# Patient Record
Sex: Male | Born: 1980 | Race: Black or African American | Hispanic: No | Marital: Single | Smoking: Current every day smoker
Health system: Southern US, Community
[De-identification: ages and names within clinical notes are randomized; demographics above are authoritative.]

## PROBLEM LIST (undated history)

## (undated) DIAGNOSIS — Q8989 Other specified congenital malformations: Secondary | ICD-10-CM

## (undated) DIAGNOSIS — Q898 Other specified congenital malformations: Secondary | ICD-10-CM

---

## 2005-11-30 ENCOUNTER — Emergency Department: Payer: Self-pay | Admitting: Emergency Medicine

## 2005-12-06 ENCOUNTER — Emergency Department: Payer: Self-pay | Admitting: Emergency Medicine

## 2007-07-17 ENCOUNTER — Emergency Department: Payer: Self-pay | Admitting: Internal Medicine

## 2012-04-26 ENCOUNTER — Emergency Department: Payer: Self-pay | Admitting: Emergency Medicine

## 2015-10-12 ENCOUNTER — Encounter (HOSPITAL_COMMUNITY): Payer: Self-pay | Admitting: *Deleted

## 2015-10-12 ENCOUNTER — Emergency Department (HOSPITAL_COMMUNITY)
Admission: EM | Admit: 2015-10-12 | Discharge: 2015-10-12 | Disposition: A | Discharge status: Home / Self Care | Payer: Self-pay | Attending: Emergency Medicine | Admitting: Emergency Medicine

## 2015-10-12 DIAGNOSIS — W290XXA Contact with powered kitchen appliance, initial encounter: Secondary | ICD-10-CM | POA: Insufficient documentation

## 2015-10-12 DIAGNOSIS — S61512A Laceration without foreign body of left wrist, initial encounter: Secondary | ICD-10-CM | POA: Insufficient documentation

## 2015-10-12 DIAGNOSIS — Y929 Unspecified place or not applicable: Secondary | ICD-10-CM | POA: Insufficient documentation

## 2015-10-12 DIAGNOSIS — F17210 Nicotine dependence, cigarettes, uncomplicated: Secondary | ICD-10-CM | POA: Insufficient documentation

## 2015-10-12 DIAGNOSIS — Y9389 Activity, other specified: Secondary | ICD-10-CM | POA: Insufficient documentation

## 2015-10-12 DIAGNOSIS — Y990 Civilian activity done for income or pay: Secondary | ICD-10-CM | POA: Insufficient documentation

## 2015-10-12 MED ORDER — TETANUS-DIPHTH-ACELL PERTUSSIS 5-2.5-18.5 LF-MCG/0.5 IM SUSP: 0.5000 mL | Freq: Once | INTRAMUSCULAR | Status: DC

## 2015-10-12 MED ORDER — IBUPROFEN 800 MG PO TABS
800.0000 mg | ORAL_TABLET | Freq: Once | ORAL | Status: AC
  Administered 2015-10-12: 800 mg via ORAL
  Filled 2015-10-12: qty 1

## 2015-10-12 MED ORDER — PENTAFLUOROPROP-TETRAFLUOROETH EX AERO: INHALATION_SPRAY | Freq: Once | CUTANEOUS | Status: DC

## 2015-10-12 MED ORDER — PENTAFLUOROPROP-TETRAFLUOROETH EX AERO
INHALATION_SPRAY | CUTANEOUS | Status: AC
  Filled 2015-10-12: qty 103.5

## 2015-10-12 NOTE — Discharge Instructions (Signed)
Your wound was repaired with staples. Please keep your wound clean and dry. Please have these removed in 7 days. Please see your primary physician, or return to the emergency department if any pus like drainage, increased redness, red streaks, or fever, or signs of infection. Use Tylenol or ibuprofen for soreness. Stitches, Staples, or Adhesive Wound Closure Doctors use stitches (sutures), staples, and certain glue (skin adhesives) to hold your skin together while it heals (wound closure). You may need this treatment after you have surgery or if you cut your skin accidentally. These methods help your skin heal more quickly. They also make it less likely that you will have a scar. WHAT ARE THE DIFFERENT KINDS OF WOUND CLOSURES? There are many options for wound closure. The one that your doctor uses depends on how deep and large your wound is. Adhesive Glue To use this glue to close a wound, your doctor holds the edges of the wound together and paints the glue on the surface of your skin. You may need more than one layer of glue. Then the wound may be covered with a light bandage (dressing). This type of skin closure may be used for small wounds that are not deep (superficial). Using glue for wound closure is less painful than other methods. It does not require a medicine that numbs the area. This method also leaves nothing to be removed. Adhesive glue is often used for children and on facial wounds. Adhesive glue cannot be used for wounds that are deep, uneven, or bleeding. It is not used inside of a wound.  Adhesive Strips These strips are made of sticky (adhesive), porous paper. They are placed across your skin edges like a regular adhesive bandage. You leave them on until they fall off. Adhesive strips may be used to close very superficial wounds. They may also be used along with sutures to improve closure of your skin edges.  Sutures Sutures are the oldest method of wound closure. Sutures can be made  from natural or synthetic materials. They can be made from a material that your body can break down as your wound heals (absorbable), or they can be made from a material that needs to be removed from your skin (nonabsorbable). They come in many different strengths and sizes. Your doctor attaches the sutures to a steel needle on one end. Sutures can be passed through your skin, or through the tissues beneath your skin. Then they are tied and cut. Your skin edges may be closed in one continuous stitch or in separate stitches. Sutures are strong and can be used for all kinds of wounds. Absorbable sutures may be used to close tissues under the skin. The disadvantage of sutures is that they may cause skin reactions that lead to infection. Nonabsorbable sutures need to be removed. Staples When surgical staples are used to close a wound, the edges of your skin on both sides of the wound are brought close together. A staple is placed across the wound, and an instrument secures the edges together. Staples are often used to close surgical cuts (incisions). Staples are faster to use than sutures, and they cause less reaction from your skin. Staples need to be removed using a tool that bends the staples away from your skin. HOW DO I CARE FOR MY WOUND CLOSURE?  Take medicines only as told by your doctor.  If you were prescribed an antibiotic medicine for your wound, finish it all even if you start to feel better.  Use ointments  or creams only as told by your doctor.  Wash your hands with soap and water before and after touching your wound.  Do not soak your wound in water. Do not take baths, swim, or use a hot tub until your doctor says it is okay.  Ask your doctor when you can start showering. Cover your wound if told by your doctor.  Do not take out your own sutures or staples.  Do not pick at your wound. Picking can cause an infection.  Keep all follow-up visits as told by your doctor. This is  important. HOW LONG WILL I HAVE MY WOUND CLOSURE?   Leave adhesive glue on your skin until the glue peels away.  Leave adhesive strips on your skin until they fall off.  Absorbable sutures will dissolve within several days.  Nonabsorbable sutures and staples must be removed. The location of the wound will determine how long they stay in. This can range from several days to a couple of weeks. WHEN SHOULD I SEEK HELP FOR MY WOUND CLOSURE? Contact your doctor if:  You have a fever.  You have chills.  You have redness, puffiness (swelling), or pain at the site of your wound.  You have fluid, blood, or pus coming from your wound.  There is a bad smell coming from your wound.  The skin edges of your wound start to separate after your sutures have been removed.  Your wound becomes thick, raised, and darker in color after your sutures come out (scarring).   This information is not intended to replace advice given to you by your health care provider. Make sure you discuss any questions you have with your health care provider.   Document Released: 02/05/2009 Document Revised: 05/01/2014 Document Reviewed: 09/17/2013 Elsevier Interactive Patient Education [employer]2016 Elsevier Inc.

## 2015-10-12 NOTE — ED Notes (Signed)
Pt. Reports left wrist laceration from electric grinder while at work.

## 2015-10-12 NOTE — ED Notes (Signed)
PA at bedside.

## 2015-10-12 NOTE — ED Provider Notes (Signed)
CSN: [id number]     Arrival date & time 10/12/15  0957 History   First MD Initiated Contact with Patient 10/12/15 1025     Chief Complaint  Patient presents with  . Extremity Laceration     (Consider location/radiation/quality/duration/timing/severity/associated sxs/prior Treatment) HPI Comments: Patient is a 35 year old male  who presents to the emergency department with a complaint of laceration to the left wrist.  The patient states he was using an electric grinder while he was at work. There was a problem with the post that he was cutting, and he sustained a laceration onto the left wrist on. He was able to get the bleeding controlled by applying pressure. He states that he is not on anticoagulation medications. He has good range of motion of his wrist and fingers. He presents to the emergency department for evaluation and laceration repair.  The history is provided by the patient.    History reviewed. No pertinent past medical history. History reviewed. No pertinent past surgical history. History reviewed. No pertinent family history. Social History  Substance Use Topics  . Smoking status: Current Every Day Smoker -- 0.25 packs/day    Types: Cigarettes  . Smokeless tobacco: None  . Alcohol Use: No    Review of Systems  Constitutional: Negative for activity change.       All ROS Neg except as noted in HPI  HENT: Negative for nosebleeds.   Eyes: Negative for photophobia and discharge.  Respiratory: Negative for cough, shortness of breath and wheezing.   Cardiovascular: Negative for chest pain and palpitations.  Gastrointestinal: Negative for abdominal pain and blood in stool.  Genitourinary: Negative for dysuria, frequency and hematuria.  Musculoskeletal: Negative for back pain, arthralgias and neck pain.  Skin: Negative.   Neurological: Negative for dizziness, seizures and speech difficulty.  Psychiatric/Behavioral: Negative for hallucinations and confusion.  All other  systems reviewed and are negative.     Allergies  Review of patient's allergies indicates no known allergies.  Home Medications   Prior to Admission medications   Not on File   BP 143/91 mmHg  Pulse 81  Temp(Src) 98 F (36.7 C) (Oral)  Resp 18  Ht 5\' 11"  (1.803 m)  Wt 79.379 kg  BMI 24.42 kg/m2  SpO2 98% Physical Exam  Constitutional: He is oriented to person, place, and time. He appears well-developed and well-nourished.  Non-toxic appearance.  HENT:  Head: Normocephalic.  Right Ear: Tympanic membrane and external ear normal.  Left Ear: Tympanic membrane and external ear normal.  Eyes: EOM and lids are normal. Pupils are equal, round, and reactive to light.  Neck: Normal range of motion. Neck supple. Carotid bruit is not present.  Cardiovascular: Normal rate, regular rhythm, normal heart sounds, intact distal pulses and normal pulses.   Pulmonary/Chest: Breath sounds normal. No respiratory distress.  Abdominal: Soft. Bowel sounds are normal. There is no tenderness. There is no guarding.  Musculoskeletal: Normal range of motion.       Left wrist: He exhibits tenderness and laceration. He exhibits normal range of motion.       Arms: No bone or tendon involvement of the laceration of the left wrist. Capillary refill is less than 2 seconds. Radial pulses 2+.  Lymphadenopathy:       Head (right side): No submandibular adenopathy present.       Head (left side): No submandibular adenopathy present.    He has no cervical adenopathy.  Neurological: He is alert and oriented to person, place, and time.  He has normal strength. No cranial nerve deficit or sensory deficit.  Skin: Skin is warm and dry.  Psychiatric: He has a normal mood and affect. His speech is normal.  Nursing note and vitals reviewed.   ED Course  .Marland KitchenLaceration Repair Date/Time: 10/12/2015 11:18 AM Performed by: Ivery Quale Authorized by: Ivery Quale Consent: Verbal consent obtained. Risks and benefits:  risks, benefits and alternatives were discussed Consent given by: patient Patient understanding: patient states understanding of the procedure being performed Patient identity confirmed: arm band Time out: Immediately prior to procedure a "time out" was called to verify the correct patient, procedure, equipment, support staff and site/side marked as required. Body area: upper extremity Location details: left wrist Laceration length: 2.4 cm Foreign bodies: no foreign bodies Tendon involvement: none Vascular damage: no Local anesthetic: topical anesthetic (GeBauers spray) Preparation: Patient was prepped and draped in the usual sterile fashion. Irrigation solution: saline Skin closure: staples Number of sutures: 4 Approximation: close Approximation difficulty: simple Dressing: gauze roll Patient tolerance: Patient tolerated the procedure well with no immediate complications   (including critical care time) Labs Review Labs Reviewed - No data to display  Imaging Review No results found. I have personally reviewed and evaluated these images and lab results as part of my medical decision-making.   EKG Interpretation None      MDM Vital signs reviewed. Patient states his tetanus status is up-to-date. The wound was repaired with staples. We discussed the need to have the staples removed in 7 days. The patient acknowledges understanding of the discharge instructions, as well as how to keep the wound clean and dry. Patient is in agreement with this discharge plan.    Final diagnoses:  Laceration of wrist, left, initial encounter    **I have reviewed nursing notes, vital signs, and all appropriate lab and imaging results for this patient.Ivery Quale, PA-C 10/12/15 1212  Azalia Bilis, MD 10/12/15 [phone]

## 2015-10-19 ENCOUNTER — Encounter (HOSPITAL_COMMUNITY): Payer: Self-pay | Admitting: Emergency Medicine

## 2015-10-19 ENCOUNTER — Emergency Department (HOSPITAL_COMMUNITY)
Admission: EM | Admit: 2015-10-19 | Discharge: 2015-10-19 | Disposition: A | Discharge status: Home / Self Care | Payer: Self-pay | Attending: Emergency Medicine | Admitting: Emergency Medicine

## 2015-10-19 DIAGNOSIS — Z4802 Encounter for removal of sutures: Secondary | ICD-10-CM | POA: Insufficient documentation

## 2015-10-19 DIAGNOSIS — F17210 Nicotine dependence, cigarettes, uncomplicated: Secondary | ICD-10-CM | POA: Insufficient documentation

## 2015-10-19 NOTE — Discharge Instructions (Signed)
Incision Care °An incision is when a surgeon cuts into your body. After surgery, the incision needs to be cared for properly to prevent infection.  °HOW TO CARE FOR YOUR INCISION °· Take medicines only as directed by your health care provider. °· There are many different ways to close and cover an incision, including stitches, skin glue, and adhesive strips. Follow your health care provider's instructions on: °¨ Incision care. °¨ Bandage (dressing) changes and removal. °¨ Incision closure removal. °· Do not take baths, swim, or use a hot tub until your health care provider approves. You may shower as directed by your health care provider. °· Resume your normal diet and activities as directed. °· Use anti-itch medicine (such as an antihistamine) as directed by your health care provider. The incision may itch while it is healing. Do not pick or scratch at the incision. °· Drink enough fluid to keep your urine clear or pale yellow. °SEEK MEDICAL CARE IF:  °· You have drainage, redness, swelling, or pain at your incision site. °· You have muscle aches, chills, or a general ill feeling. °· You notice a bad smell coming from the incision or dressing. °· Your incision edges separate after the sutures, staples, or skin adhesive strips have been removed. °· You have persistent nausea or vomiting. °· You have a fever. °· You are dizzy. °SEEK IMMEDIATE MEDICAL CARE IF:  °· You have a rash. °· You faint. °· You have difficulty breathing. °MAKE SURE YOU:  °· Understand these instructions. °· Will watch your condition. °· Will get help right away if you are not doing well or get worse. °  °This information is not intended to replace advice given to you by your health care provider. Make sure you discuss any questions you have with your health care provider. °  °Document Released: 10/28/2004 Document Revised: 05/01/2014 Document Reviewed: 06/04/2013 °Elsevier Interactive Patient Education ©2016 Elsevier Inc. ° °

## 2015-10-19 NOTE — ED Provider Notes (Signed)
CSN: [id number]     Arrival date & time 10/19/15  1243 History   First MD Initiated Contact with Patient 10/19/15 1257     Chief Complaint  Patient presents with  . Suture / Staple Removal     (Consider location/radiation/quality/duration/timing/severity/associated sxs/prior Treatment) HPI Comments: Patient presents to the emergency department with chief complaint of requesting staple removal. He states that he sustained a laceration to his left wrist from a piece of metal one week ago. He reports good wound healing. Denies any discharge. Denies any increased pain or fever. Denies any complaints at this time.  The history is provided by the patient. No language interpreter was used.    History reviewed. No pertinent past medical history. History reviewed. No pertinent past surgical history. History reviewed. No pertinent family history. Social History  Substance Use Topics  . Smoking status: Current Every Day Smoker -- 0.25 packs/day    Types: Cigarettes  . Smokeless tobacco: None  . Alcohol Use: No    Review of Systems  Constitutional: Negative for fever.  Skin: Positive for wound.  All other systems reviewed and are negative.     Allergies  Review of patient's allergies indicates no known allergies.  Home Medications   Prior to Admission medications   Not on File   BP 115/74 mmHg  Pulse 83  Temp(Src) 98.3 F (36.8 C) (Oral)  Resp 14  Ht 5\' 11"  (1.803 m)  Wt 79.379 kg  BMI 24.42 kg/m2  SpO2 100% Physical Exam  Constitutional: He is oriented to person, place, and time. He appears well-developed and well-nourished.  HENT:  Head: Normocephalic and atraumatic.  Eyes: Conjunctivae and EOM are normal.  Neck: Normal range of motion.  Cardiovascular: Normal rate.   Pulmonary/Chest: Effort normal.  Abdominal: He exhibits no distension.  Musculoskeletal: Normal range of motion.  Neurological: He is alert and oriented to person, place, and time.  Skin: Skin is dry.   3 cm well healing laceration, 4 staples intact  Psychiatric: He has a normal mood and affect. His behavior is normal. Judgment and thought content normal.  Nursing note and vitals reviewed.   ED Course  Procedures (including critical care time) SUTURE REMOVAL Performed by: Roxy HorsemanBROWNING, Dariella Gillihan  Consent: Verbal consent obtained. Consent given by: patient Required items: required blood products, implants, devices, and special equipment available Time out: Immediately prior to procedure a "time out" was called to verify the correct patient, procedure, equipment, support staff and site/side marked as required.  Location: left wrist  Wound Appearance: clean  Sutures/Staples Removed: 4  Patient tolerance: Patient tolerated the procedure well with no immediate complications.      MDM   Final diagnoses:  Encounter for staple removal    Well healing wound.  Staples removed without complication.    Roxy HorsemanRobert Jannine Abreu, PA-C 10/19/15 1335  Marily MemosJason Mesner, MD 10/19/15 1459

## 2015-10-19 NOTE — ED Notes (Addendum)
Pt here for staple removal from wrist.

## 2015-10-21 ENCOUNTER — Emergency Department
Admission: EM | Admit: 2015-10-21 | Discharge: 2015-10-21 | Disposition: A | Discharge status: Home / Self Care | Payer: Self-pay | Attending: Emergency Medicine | Admitting: Emergency Medicine

## 2015-10-21 DIAGNOSIS — A64 Unspecified sexually transmitted disease: Secondary | ICD-10-CM | POA: Insufficient documentation

## 2015-10-21 DIAGNOSIS — F17210 Nicotine dependence, cigarettes, uncomplicated: Secondary | ICD-10-CM | POA: Insufficient documentation

## 2015-10-21 LAB — URINALYSIS COMPLETE WITH MICROSCOPIC (ARMC ONLY)
BILIRUBIN URINE: NEGATIVE
Glucose, UA: NEGATIVE mg/dL
Hgb urine dipstick: NEGATIVE
KETONES UR: NEGATIVE mg/dL
Nitrite: NEGATIVE
PROTEIN: 30 mg/dL — AB
Specific Gravity, Urine: 1.021 (ref 1.005–1.030)
Squamous Epithelial / LPF: NONE SEEN
pH: 8 (ref 5.0–8.0)

## 2015-10-21 MED ORDER — AZITHROMYCIN 500 MG PO TABS
1000.0000 mg | ORAL_TABLET | Freq: Once | ORAL | Status: AC
  Administered 2015-10-21: 1000 mg via ORAL
  Filled 2015-10-21: qty 2

## 2015-10-21 MED ORDER — AZITHROMYCIN 250 MG PO TABS
ORAL_TABLET | ORAL | Status: AC
  Administered 2015-10-21: 1000 mg via ORAL
  Filled 2015-10-21: qty 2

## 2015-10-21 MED ORDER — CEFTRIAXONE SODIUM 250 MG IJ SOLR
250.0000 mg | Freq: Once | INTRAMUSCULAR | Status: AC
  Administered 2015-10-21: 250 mg via INTRAMUSCULAR

## 2015-10-21 MED ORDER — LIDOCAINE HCL (PF) 1 % IJ SOLN
INTRAMUSCULAR | Status: AC
  Administered 2015-10-21: 1 mL
  Filled 2015-10-21: qty 5

## 2015-10-21 MED ORDER — CEFTRIAXONE SODIUM 1 G IJ SOLR
INTRAMUSCULAR | Status: AC
  Administered 2015-10-21: 250 mg via INTRAMUSCULAR
  Filled 2015-10-21: qty 10

## 2015-10-21 NOTE — ED Provider Notes (Signed)
[facility] Emergency Department Provider Note  ____________________________________________  Time seen: Approximately 11:40 PM  I have reviewed the triage vital signs and the nursing notes.   HISTORY  Chief Complaint Exposure to STD    HPI Charles Lowery is a 35 y.o. male who has 2 days of dysuria with penile discharge. Reports unprotected sex. No fevers or chills. No nausea or vomiting. No abdominal pain.   No past medical history on file.  There are no active problems to display for this patient.   No past surgical history on file.  No current outpatient prescriptions on file.  Allergies Review of patient's allergies indicates no known allergies.  No family history on file.  Social History Social History  Substance Use Topics  . Smoking status: Current Every Day Smoker -- 0.25 packs/day    Types: Cigarettes  . Smokeless tobacco: Not on file  . Alcohol Use: No    Review of Systems Constitutional: No fever/chills Eyes: No visual changes. ENT: No sore throat. Cardiovascular: Denies chest pain. Respiratory: Denies shortness of breath. Gastrointestinal: No abdominal pain.  No nausea, no vomiting.  No diarrhea.  No constipation. Genitourinary: Negative for dysuria. Musculoskeletal: Negative for back pain. Skin: Negative for rash. Neurological: Negative for headaches, focal weakness or numbness. 10-point ROS otherwise negative.  ____________________________________________   PHYSICAL EXAM:  VITAL SIGNS: ED Triage Vitals  Enc Vitals Group     BP 10/21/15 2232 143/95 mmHg     Pulse Rate 10/21/15 2231 103     Resp 10/21/15 2231 20     Temp 10/21/15 2231 98.4 F (36.9 C)     Temp Source 10/21/15 2231 Oral     SpO2 10/21/15 2231 100 %     Weight 10/21/15 2232 175 lb (79.379 kg)     Height 10/21/15 2232 6' (1.829 m)     Head Cir --      Peak Flow --      Pain Score 10/21/15 2232 6     Pain Loc --      Pain Edu? --      Excl. in  GC? --     Constitutional: Alert and oriented. Well appearing and in no acute distress. Eyes: Conjunctivae are normal.  EOMI. Mouth/Throat: Mucous membranes are moist.  Oropharynx non-erythematous. No lesions. Neck:  Supple.  No adenopathy.   Cardiovascular: Normal rate,  Respiratory: Normal respiratory effort.   Gastrointestinal: Soft and nontender. No distention GU: Circumcised male nontender scrotum. No lymphadenopathy. White milky purulent discharge  Musculoskeletal: Nml ROM of upper and lower extremity joints. Neurologic:  Normal speech and language. No gross focal neurologic deficits are appreciated. No gait instability. Skin:  Skin is warm, dry and intact. No rash noted. Psychiatric: Mood and affect are normal. Speech and behavior are normal.  ____________________________________________   LABS (all labs ordered are listed, but only abnormal results are displayed)  Labs Reviewed  URINALYSIS COMPLETEWITH MICROSCOPIC ([facility] ONLY) - Abnormal; Notable for the following:    Color, Urine YELLOW (*)    APPearance CLOUDY (*)    Protein, ur 30 (*)    Leukocytes, UA 3+ (*)    Bacteria, UA RARE (*)    All other components within normal limits  CHLAMYDIA/NGC RT PCR ([facility] ONLY)   ____________________________________________  EKG   ____________________________________________  RADIOLOGY   ____________________________________________   PROCEDURES  Procedure(s) performed: None  Critical Care performed: No  ____________________________________________   INITIAL IMPRESSION / ASSESSMENT AND PLAN / ED COURSE  Pertinent  labs & imaging results that were available during my care of the patient were reviewed by me and considered in my medical decision making (see chart for details).  [age] year old male with acute onset of dysuria and penile discharge. Reports unprotected sexual intercourse. Urinalysis as above. Gonorrhea and chlamydia results pending. Treated with  Rocephin 250 mg IM and Zithromax 1 gm. I encouraged him to follow up with the health department for a further STD evaluation. He plans to do this tomorrow ____________________________________________   FINAL CLINICAL IMPRESSION(S) / ED DIAGNOSES  Final diagnoses:  STD (male)      Ignacia BayleyRobert Deysha Cartier, PA-C 10/21/15 2344  Phineas SemenGraydon Goodman, MD 10/22/15 [phone]

## 2015-10-21 NOTE — ED Notes (Signed)
Pt has yellow discharge and painful urination started 2 days ago, has had unprotected sex.

## 2015-10-21 NOTE — Discharge Instructions (Signed)
Sexually Transmitted Disease °A sexually transmitted disease (STD) is a disease or infection that may be passed (transmitted) from person to person, usually during sexual activity. This may happen by way of saliva, semen, blood, vaginal mucus, or urine. Common STDs include: °· Gonorrhea. °· Chlamydia. °· Syphilis. °· HIV and AIDS. °· Genital herpes. °· Hepatitis B and C. °· Trichomonas. °· Human papillomavirus (HPV). °· Pubic lice. °· Scabies. °· Mites. °· Bacterial vaginosis. °WHAT ARE CAUSES OF STDs? °An STD may be caused by bacteria, a virus, or parasites. STDs are often transmitted during sexual activity if one person is infected. However, they may also be transmitted through nonsexual means. STDs may be transmitted after:  °· Sexual intercourse with an infected person. °· Sharing sex toys with an infected person. °· Sharing needles with an infected person or using unclean piercing or tattoo needles. °· Having intimate contact with the genitals, mouth, or rectal areas of an infected person. °· Exposure to infected fluids during birth. °WHAT ARE THE SIGNS AND SYMPTOMS OF STDs? °Different STDs have different symptoms. Some people may not have any symptoms. If symptoms are present, they may include: °· Painful or bloody urination. °· Pain in the pelvis, abdomen, vagina, anus, throat, or eyes. °· A skin rash, itching, or irritation. °· Growths, ulcerations, blisters, or sores in the genital and anal areas. °· Abnormal vaginal discharge with or without bad odor. °· Penile discharge in men. °· Fever. °· Pain or bleeding during sexual intercourse. °· Swollen glands in the groin area. °· Yellow skin and eyes (jaundice). This is seen with hepatitis. °· Swollen testicles. °· Infertility. °· Sores and blisters in the mouth. °HOW ARE STDs DIAGNOSED? °To make a diagnosis, your health care provider may: °· Take a medical history. °· Perform a physical exam. °· Take a sample of any discharge to examine. °· Swab the throat,  cervix, opening to the penis, rectum, or vagina for testing. °· Test a sample of your first morning urine. °· Perform blood tests. °· Perform a Pap test, if this applies. °· Perform a colposcopy. °· Perform a laparoscopy. °HOW ARE STDs TREATED? °Treatment depends on the STD. Some STDs may be treated but not cured. °· Chlamydia, gonorrhea, trichomonas, and syphilis can be cured with antibiotic medicine. °· Genital herpes, hepatitis, and HIV can be treated, but not cured, with prescribed medicines. The medicines lessen symptoms. °· Genital warts from HPV can be treated with medicine or by freezing, burning (electrocautery), or surgery. Warts may come back. °· HPV cannot be cured with medicine or surgery. However, abnormal areas may be removed from the cervix, vagina, or vulva. °· If your diagnosis is confirmed, your recent sexual partners need treatment. This is true even if they are symptom-free or have a negative culture or evaluation. They should not have sex until their health care providers say it is okay. °· Your health care provider may test you for infection again 3 months after treatment. °HOW CAN I REDUCE MY RISK OF GETTING AN STD? °Take these steps to reduce your risk of getting an STD: °· Use latex condoms, dental dams, and water-soluble lubricants during sexual activity. Do not use petroleum jelly or oils. °· Avoid having multiple sex partners. °· Do not have sex with someone who has other sex partners °· Do not have sex with anyone you do not know or who is at high risk for an STD. °· Avoid risky sex practices that can break your skin. °· Do not have sex   if you have open sores on your mouth or skin.  Avoid drinking too much alcohol or taking illegal drugs. Alcohol and drugs can affect your judgment and put you in a vulnerable position.  Avoid engaging in oral and anal sex acts.  Get vaccinated for HPV and hepatitis. If you have not received these vaccines in the past, talk to your health care  provider about whether one or both might be right for you.  If you are at risk of being infected with HIV, it is recommended that you take a prescription medicine daily to prevent HIV infection. This is called pre-exposure prophylaxis (PrEP). You are considered at risk if:  You are a man who has sex with other men (MSM).  You are a heterosexual man or woman and are sexually active with more than one partner.  You take drugs by injection.  You are sexually active with a partner who has HIV.  Talk with your health care provider about whether you are at high risk of being infected with HIV. If you choose to begin PrEP, you should first be tested for HIV. You should then be tested every 3 months for as long as you are taking PrEP. WHAT SHOULD I DO IF I THINK I HAVE AN STD?  See your health care provider.  Tell your sexual partner(s). They should be tested and treated for any STDs.  Do not have sex until your health care provider says it is okay. WHEN SHOULD I GET IMMEDIATE MEDICAL CARE? Contact your health care provider right away if:   You have severe abdominal pain.  You are a man and notice swelling or pain in your testicles.  You are a woman and notice swelling or pain in your vagina.   This information is not intended to replace advice given to you by your health care provider. Make sure you discuss any questions you have with your health care provider.   Document Released: 07/01/2002 Document Revised: 05/01/2014 Document Reviewed: 10/29/2012 Elsevier Interactive Patient Education [employer]2016 [employer]   Your symptoms should resolve. You can follow-up with the health department to consider testing for other diseases. Return to the emergency room for any worsening symptoms.

## 2015-10-22 ENCOUNTER — Telehealth: Payer: Self-pay | Admitting: Emergency Medicine

## 2015-10-22 LAB — CHLAMYDIA/NGC RT PCR (ARMC ONLY)
Chlamydia Tr: NOT DETECTED
N GONORRHOEAE: DETECTED — AB

## 2015-10-22 NOTE — ED Notes (Signed)
Called patient to inform of std results--pt was treated in the ED.  Also advised to inform partner and that free treatment is available at [facility]

## 2015-12-10 ENCOUNTER — Emergency Department
Admission: EM | Admit: 2015-12-10 | Discharge: 2015-12-10 | Disposition: A | Discharge status: Home / Self Care | Payer: Self-pay | Attending: Emergency Medicine | Admitting: Emergency Medicine

## 2015-12-10 ENCOUNTER — Encounter: Payer: Self-pay | Admitting: Emergency Medicine

## 2015-12-10 DIAGNOSIS — Z202 Contact with and (suspected) exposure to infections with a predominantly sexual mode of transmission: Secondary | ICD-10-CM | POA: Insufficient documentation

## 2015-12-10 DIAGNOSIS — R369 Urethral discharge, unspecified: Secondary | ICD-10-CM

## 2015-12-10 DIAGNOSIS — F17210 Nicotine dependence, cigarettes, uncomplicated: Secondary | ICD-10-CM | POA: Insufficient documentation

## 2015-12-10 LAB — URINALYSIS COMPLETE WITH MICROSCOPIC (ARMC ONLY)
BILIRUBIN URINE: NEGATIVE
Bacteria, UA: NONE SEEN
Glucose, UA: NEGATIVE mg/dL
HGB URINE DIPSTICK: NEGATIVE
KETONES UR: NEGATIVE mg/dL
NITRITE: NEGATIVE
PH: 5 (ref 5.0–8.0)
Protein, ur: NEGATIVE mg/dL
SPECIFIC GRAVITY, URINE: 1.018 (ref 1.005–1.030)
SQUAMOUS EPITHELIAL / LPF: NONE SEEN

## 2015-12-10 LAB — CHLAMYDIA/NGC RT PCR (ARMC ONLY)
Chlamydia Tr: NOT DETECTED
N GONORRHOEAE: DETECTED — AB

## 2015-12-10 MED ORDER — CEFTRIAXONE SODIUM 250 MG IJ SOLR
250.0000 mg | Freq: Once | INTRAMUSCULAR | Status: AC
  Administered 2015-12-10: 250 mg via INTRAMUSCULAR
  Filled 2015-12-10: qty 250

## 2015-12-10 MED ORDER — AZITHROMYCIN 500 MG PO TABS
1000.0000 mg | ORAL_TABLET | Freq: Once | ORAL | Status: AC
  Administered 2015-12-10: 1000 mg via ORAL
  Filled 2015-12-10: qty 2

## 2015-12-10 NOTE — ED Triage Notes (Signed)
Pt to ed with c/o yellow penile drainage x 3 days.

## 2015-12-10 NOTE — ED Provider Notes (Signed)
[facility] Emergency Department Provider Note  ____________________________________________  Time seen: Approximately 11:44 PM  I have reviewed the triage vital signs and the nursing notes.   HISTORY  Chief Complaint Penile Discharge    HPI Charles Lowery is a 35 y.o. male who presents emergency department complaining of penile discharge. Patient states that he was diagnosed with gonorrhea approximately a month ago. Patient states that he return to the same partner within the last week and has had a repeat of symptoms. Patient denies any genital lesions or chancres. Patient denies any abdominal pain, flank pain, hematuria. Patient does endorse dysuria with this condition. Patient denies any fevers or chills. Patient reports that symptoms completely resolved with Zithromax and Rocephin.   History reviewed. No pertinent past medical history.  There are no active problems to display for this patient.   History reviewed. No pertinent surgical history.  Prior to Admission medications   Not on File    Allergies Review of patient's allergies indicates no known allergies.  History reviewed. No pertinent family history.  Social History Social History  Substance Use Topics  . Smoking status: Current Every Day Smoker    Packs/day: 0.25    Types: Cigarettes  . Smokeless tobacco: Never Used  . Alcohol use Yes     Review of Systems  Constitutional: No fever/chills Cardiovascular: no chest pain. Respiratory: no cough. No SOB. Gastrointestinal: No abdominal pain.  No nausea, no vomiting.   Genitourinary: Positive for dysuria. Positive for penile discharge. No hematuria.  Musculoskeletal: Negative for musculoskeletal pain. Skin: Negative for rash, abrasions, lacerations, ecchymosis. Neurological: Negative for headaches, focal weakness or numbness. 10-point ROS otherwise negative.  ____________________________________________   PHYSICAL EXAM:  VITAL  SIGNS: ED Triage Vitals  Enc Vitals Group     BP 12/10/15 1110 135/89     Pulse Rate 12/10/15 1110 85     Resp 12/10/15 1110 18     Temp 12/10/15 1110 98.1 F (36.7 C)     Temp Source 12/10/15 1110 Oral     SpO2 12/10/15 1110 100 %     Weight 12/10/15 1110 175 lb (79.4 kg)     Height --      Head Circumference --      Peak Flow --      Pain Score 12/10/15 1111 0     Pain Loc --      Pain Edu? --      Excl. in GC? --      Constitutional: Alert and oriented. Well appearing and in no acute distress. Eyes: Conjunctivae are normal. PERRL. EOMI. Head: Atraumatic. Cardiovascular: Normal rate, regular rhythm. Normal S1 and S2.  Good peripheral circulation. Respiratory: Normal respiratory effort without tachypnea or retractions. Lungs CTAB. Good air entry to the bases with no decreased or absent breath sounds. Gastrointestinal: Bowel sounds 4 quadrants. Soft and nontender to palpation. No guarding or rigidity. No palpable masses. No distention. No CVA tenderness. Genitourinary: No external lesions, chancres, sores. No visible drainage. No tenderness to palpation over testicles or epididymal cords. Musculoskeletal: Full range of motion to all extremities. No gross deformities appreciated. Neurologic:  Normal speech and language. No gross focal neurologic deficits are appreciated.  Skin:  Skin is warm, dry and intact. No rash noted. Psychiatric: Mood and affect are normal. Speech and behavior are normal. Patient exhibits appropriate insight and judgement.   ____________________________________________   LABS (all labs ordered are listed, but only abnormal results are displayed)  Labs Reviewed  URINALYSIS  COMPLETEWITH MICROSCOPIC ([facility] ONLY) - Abnormal; Notable for the following:       Result Value   Color, Urine YELLOW (*)    APPearance CLOUDY (*)    Leukocytes, UA 3+ (*)    All other components within normal limits  CHLAMYDIA/NGC RT PCR ([facility] ONLY)    ____________________________________________  EKG   ____________________________________________  RADIOLOGY   No results found.  ____________________________________________    PROCEDURES  Procedure(s) performed:    Procedures    Medications  cefTRIAXone (ROCEPHIN) injection 250 mg (not administered)  azithromycin (ZITHROMAX) tablet 1,000 mg (not administered)     ____________________________________________   INITIAL IMPRESSION / ASSESSMENT AND PLAN / ED COURSE  Pertinent labs & imaging results that were available during my care of the patient were reviewed by me and considered in my medical decision making (see chart for details).  Clinical Course    Patient's diagnosis is consistent with Encounter with STDs. Patient will be given Rocephin injection and Zithromax. Patient is instructed to follow-up with the health Department in 3 weeks for repeat testing to ensure clearance. At that time, patient can undergo further STD testing if so desired. Patient verbalizes understanding of diagnosis, treatment, follow-up plan. Patient verbalizes compliance with same.. Patient is given ED precautions to return to the ED for any worsening or new symptoms.     ____________________________________________  FINAL CLINICAL IMPRESSION(S) / ED DIAGNOSES  Final diagnoses:  Exposure to sexually transmitted disease (STD)  Penile discharge      NEW MEDICATIONS STARTED DURING THIS VISIT:  New Prescriptions   No medications on file        This chart was dictated using voice recognition software/Dragon. Despite best efforts to proofread, errors can occur which can change the meaning. Any change was purely unintentional.    Racheal PatchesJonathan D Cuthriell, PA-C 12/10/15 1252    Minna AntisKevin Paduchowski, MD 12/10/15 [phone]

## 2015-12-10 NOTE — ED Notes (Signed)
Pt verbalized understanding of discharge instructions. NAD at this time. 

## 2015-12-13 ENCOUNTER — Telehealth: Payer: Self-pay | Admitting: Emergency Medicine

## 2015-12-13 NOTE — Telephone Encounter (Signed)
Called to inform of test results positive for gonorrhea.  He was treated int the ED.  No answer and no voicemail.

## 2016-01-10 MED ORDER — METHYLPREDNISOLONE SODIUM SUCC 125 MG IJ SOLR
INTRAMUSCULAR | Status: AC
  Filled 2016-01-10: qty 2

## 2016-01-10 MED ORDER — HYDROCODONE-ACETAMINOPHEN 5-325 MG PO TABS
ORAL_TABLET | ORAL | Status: AC
Start: 2016-01-10 — End: 2016-01-11
  Filled 2016-01-10: qty 1

## 2016-04-03 ENCOUNTER — Emergency Department
Admission: EM | Admit: 2016-04-03 | Discharge: 2016-04-03 | Disposition: A | Discharge status: Home / Self Care | Payer: No Typology Code available for payment source | Attending: Emergency Medicine | Admitting: Emergency Medicine

## 2016-04-03 ENCOUNTER — Emergency Department: Payer: No Typology Code available for payment source

## 2016-04-03 DIAGNOSIS — Y92410 Unspecified street and highway as the place of occurrence of the external cause: Secondary | ICD-10-CM | POA: Insufficient documentation

## 2016-04-03 DIAGNOSIS — S46912A Strain of unspecified muscle, fascia and tendon at shoulder and upper arm level, left arm, initial encounter: Secondary | ICD-10-CM | POA: Diagnosis not present

## 2016-04-03 DIAGNOSIS — Y939 Activity, unspecified: Secondary | ICD-10-CM | POA: Diagnosis not present

## 2016-04-03 DIAGNOSIS — F17210 Nicotine dependence, cigarettes, uncomplicated: Secondary | ICD-10-CM | POA: Diagnosis not present

## 2016-04-03 DIAGNOSIS — Y999 Unspecified external cause status: Secondary | ICD-10-CM | POA: Insufficient documentation

## 2016-04-03 DIAGNOSIS — S4992XA Unspecified injury of left shoulder and upper arm, initial encounter: Secondary | ICD-10-CM | POA: Diagnosis present

## 2016-04-03 MED ORDER — HYDROCODONE-ACETAMINOPHEN 5-325 MG PO TABS: 1.0000 | ORAL_TABLET | ORAL | 0 refills | Status: DC | PRN

## 2016-04-03 MED ORDER — NAPROXEN 500 MG PO TABS: 500.0000 mg | ORAL_TABLET | Freq: Two times a day (BID) | ORAL | 0 refills | Status: DC

## 2016-04-03 MED ORDER — HYDROCODONE-ACETAMINOPHEN 5-325 MG PO TABS
1.0000 | ORAL_TABLET | Freq: Once | ORAL | Status: AC
  Administered 2016-04-03: 1 via ORAL
  Filled 2016-04-03: qty 1
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
## 2016-04-03 NOTE — Discharge Instructions (Signed)
Are as needed for swelling and pain. Begin taking Norco as needed for severe pain. Naproxen 500 mg twice a day with food. Wear sling for support as needed. [facility] if any continued problems.
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
## 2018-04-21 ENCOUNTER — Encounter: Payer: Self-pay | Admitting: Emergency Medicine

## 2018-04-21 ENCOUNTER — Emergency Department
Admission: EM | Admit: 2018-04-21 | Discharge: 2018-04-21 | Disposition: A | Discharge status: Home / Self Care | Payer: Self-pay | Attending: Emergency Medicine | Admitting: Emergency Medicine

## 2018-04-21 DIAGNOSIS — Y9389 Activity, other specified: Secondary | ICD-10-CM | POA: Insufficient documentation

## 2018-04-21 DIAGNOSIS — Y929 Unspecified place or not applicable: Secondary | ICD-10-CM | POA: Insufficient documentation

## 2018-04-21 DIAGNOSIS — F17210 Nicotine dependence, cigarettes, uncomplicated: Secondary | ICD-10-CM | POA: Insufficient documentation

## 2018-04-21 DIAGNOSIS — S025XXA Fracture of tooth (traumatic), initial encounter for closed fracture: Secondary | ICD-10-CM | POA: Insufficient documentation

## 2018-04-21 DIAGNOSIS — Y999 Unspecified external cause status: Secondary | ICD-10-CM | POA: Insufficient documentation

## 2018-04-21 DIAGNOSIS — K047 Periapical abscess without sinus: Secondary | ICD-10-CM | POA: Insufficient documentation

## 2018-04-21 DIAGNOSIS — K0889 Other specified disorders of teeth and supporting structures: Secondary | ICD-10-CM

## 2018-04-21 DIAGNOSIS — X58XXXA Exposure to other specified factors, initial encounter: Secondary | ICD-10-CM | POA: Insufficient documentation

## 2018-04-21 HISTORY — DX: Other specified congenital malformations: Q89.8

## 2018-04-21 HISTORY — DX: Other specified congenital malformations: Q89.89

## 2018-04-21 MED ORDER — AMOXICILLIN 875 MG PO TABS: 875.0000 mg | ORAL_TABLET | Freq: Two times a day (BID) | ORAL | 0 refills | Status: DC

## 2018-04-21 MED ORDER — AMOXICILLIN 500 MG PO CAPS
1000.0000 mg | ORAL_CAPSULE | Freq: Once | ORAL | Status: AC
  Administered 2018-04-21: 1000 mg via ORAL
  Filled 2018-04-21: qty 2

## 2018-04-21 MED ORDER — HYDROCODONE-ACETAMINOPHEN 5-325 MG PO TABS: 1.0000 | ORAL_TABLET | Freq: Four times a day (QID) | ORAL | 0 refills | Status: DC | PRN

## 2018-04-21 NOTE — Discharge Instructions (Addendum)
Please take antibiotics as prescribed.  Take pain medicine as prescribed.  Follow-up with dental clinic as soon as possible.  Return to the ER for any fevers, difficulty swallowing, increasing facial swelling

## 2018-04-21 NOTE — ED Provider Notes (Signed)
[facility] EMERGENCY DEPARTMENT Provider Note   CSN: [id number] Arrival date & time: 04/21/18  1835     History   Chief Complaint Chief Complaint  Patient presents with  . Dental Pain    HPI Charles BeaversMarcus Lowery is a 37 y.o. male presents to the emergency department for evaluation of dental pain.  Patient states 2 weeks ago he cracked his left lower second molar on a hard candy.  Over the last couple days has had severe pain.  He is tried ibuprofen with no improvement.  Has had mild facial swelling but no fevers.  Tolerating p.o. well.  HPI  Past Medical History:  Diagnosis Date  . Hemihypertrophy     There are no active problems to display for this patient.   History reviewed. No pertinent surgical history.      Home Medications    Prior to Admission medications   Medication Sig Start Date End Date Taking? Authorizing Provider  amoxicillin (AMOXIL) 875 MG tablet Take 1 tablet (875 mg total) by mouth 2 (two) times daily. X 10 days 04/21/18   Evon SlackGaines, Maryetta Shafer C, PA-C  HYDROcodone-acetaminophen (NORCO) 5-325 MG tablet Take 1 tablet by mouth every 6 (six) hours as needed for moderate pain. 04/21/18   Evon SlackGaines, Brenda Samano C, PA-C  naproxen (NAPROSYN) 500 MG tablet Take 1 tablet (500 mg total) by mouth 2 (two) times daily with a meal. 04/03/16   Tommi RumpsSummers, Rhonda L, PA-C    Family History No family history on file.  Social History Social History   Tobacco Use  . Smoking status: Current Every Day Smoker    Packs/day: 0.25    Types: Cigarettes  . Smokeless tobacco: Never Used  Substance Use Topics  . Alcohol use: Yes  . Drug use: No     Allergies   Patient has no known allergies.   Review of Systems Review of Systems  Constitutional: Negative.  Negative for chills and fever.  HENT: Positive for dental problem and facial swelling. Negative for drooling, mouth sores, trouble swallowing and voice change.   Respiratory: Negative for shortness of breath.    Cardiovascular: Negative for chest pain.  Gastrointestinal: Negative for nausea and vomiting.  Musculoskeletal: Negative for arthralgias, neck pain and neck stiffness.  Skin: Negative.   Psychiatric/Behavioral: Negative for confusion.  All other systems reviewed and are negative.    Physical Exam Updated Vital Signs BP 127/88 (BP Location: Left Arm)   Pulse 93   Temp 100 F (37.8 C) (Oral)   Resp 18   Ht 6' (1.829 m)   Wt 81.6 kg   SpO2 100%   BMI 24.41 kg/m   Physical Exam Constitutional:      General: He is not in acute distress.    Appearance: He is well-developed.  HENT:     Head: Normocephalic and atraumatic.     Jaw: No trismus.     Right Ear: External ear normal.     Left Ear: External ear normal.     Nose: Nose normal.     Mouth/Throat:     Mouth: No oral lesions.     Dentition: Normal dentition.     Pharynx: Uvula midline. No uvula swelling.   Neck:     Musculoskeletal: Normal range of motion and neck supple.  Cardiovascular:     Rate and Rhythm: Normal rate.     Heart sounds: No murmur. No friction rub. No gallop.   Pulmonary:     Effort: Pulmonary effort is  normal. No respiratory distress.     Breath sounds: Normal breath sounds.  Skin:    General: Skin is warm and dry.  Neurological:     Mental Status: He is alert and oriented to person, place, and time.  Psychiatric:        Mood and Affect: Mood normal.        Behavior: Behavior normal.        Thought Content: Thought content normal.      ED Treatments / Results  Labs (all labs ordered are listed, but only abnormal results are displayed) Labs Reviewed - No data to display  EKG None  Radiology No results found.  Procedures Procedures (including critical care time)  Medications Ordered in ED Medications  amoxicillin (AMOXIL) capsule 1,000 mg (has no administration in time range)     Initial Impression / Assessment and Plan / ED Course  I have reviewed the triage vital signs  and the nursing notes.  Pertinent labs & imaging results that were available during my care of the patient were reviewed by me and considered in my medical decision making (see chart for details).     37 year old male with fracture of tooth with underlying dental infection.  Patient started on antibiotics.  He is given a prescription for Norco.  He will follow with dental clinic.  He understands signs and return to ED for.  Final Clinical Impressions(s) / ED Diagnoses   Final diagnoses:  Pain, dental  Closed fracture of tooth, initial encounter    ED Discharge Orders         Ordered    HYDROcodone-acetaminophen (NORCO) 5-325 MG tablet  Every 6 hours PRN     04/21/18 2015    amoxicillin (AMOXIL) 875 MG tablet  2 times daily     04/21/18 2015           Ronnette JuniperGaines, Judeth Gilles C, PA-C 04/21/18 2024    Phineas SemenGoodman, Graydon, MD 04/21/18 2310

## 2018-04-21 NOTE — ED Triage Notes (Signed)
Patient presents to the ED with left sided facial swelling after breaking a tooth last week.  Patient states, "I should have gone to the dentist but I was trying to make it though Christmas and everything."  Patient reports waking up this am with facial swelling.

## 2018-04-21 NOTE — ED Notes (Signed)
Pt dropped one capsule on ground. 1 new capsule pulled from pyxis and given to pt.

## 2018-04-21 NOTE — ED Notes (Signed)
Pt states L sided tooth pain. Sitting in bed groaning.

## 2018-10-29 ENCOUNTER — Encounter: Payer: Self-pay | Admitting: Emergency Medicine

## 2018-10-29 ENCOUNTER — Other Ambulatory Visit: Payer: Self-pay

## 2018-10-29 ENCOUNTER — Emergency Department
Admission: EM | Admit: 2018-10-29 | Discharge: 2018-10-29 | Disposition: A | Discharge status: Home / Self Care | Payer: Self-pay | Attending: Emergency Medicine | Admitting: Emergency Medicine

## 2018-10-29 DIAGNOSIS — F17210 Nicotine dependence, cigarettes, uncomplicated: Secondary | ICD-10-CM | POA: Insufficient documentation

## 2018-10-29 DIAGNOSIS — K047 Periapical abscess without sinus: Secondary | ICD-10-CM | POA: Insufficient documentation

## 2018-10-29 MED ORDER — NAPROXEN 500 MG PO TABS: 500.0000 mg | ORAL_TABLET | Freq: Two times a day (BID) | ORAL | 0 refills | Status: DC

## 2018-10-29 MED ORDER — AMOXICILLIN 875 MG PO TABS: 875.0000 mg | ORAL_TABLET | Freq: Two times a day (BID) | ORAL | 0 refills | Status: DC

## 2018-10-29 NOTE — ED Triage Notes (Signed)
C/O left lower jaw / dental pain.  States symptoms started 2 days ago.

## 2018-10-29 NOTE — ED Notes (Signed)
Dental pain xfew days with LFT side. Denies fever

## 2018-10-29 NOTE — ED Provider Notes (Signed)
[facility] Emergency Department Provider Note ____________________________________________  Time seen: Approximately 8:51 PM  I have reviewed the triage vital signs and the nursing notes.   HISTORY  Chief Complaint Dental Pain   HPI Charles Lowery is a 38 y.o. male who presents to the emergency department for treatment and evaluation of dental pain.  Patient reports having chronic dental pain with swelling that started 2 days ago.  He states that he has a dentist appointment next week, but was advised that he needed to come here to get antibiotics before they would be able to do anything.  He denies fever.  Some relief with ibuprofen.   Past Medical History:  Diagnosis Date  . Hemihypertrophy     There are no active problems to display for this patient.   History reviewed. No pertinent surgical history.  Prior to Admission medications   Medication Sig Start Date End Date Taking? Authorizing Provider  amoxicillin (AMOXIL) 875 MG tablet Take 1 tablet (875 mg total) by mouth 2 (two) times daily. X 10 days 10/29/18   Sherrie George B, FNP  HYDROcodone-acetaminophen (NORCO) 5-325 MG tablet Take 1 tablet by mouth every 6 (six) hours as needed for moderate pain. 04/21/18   Duanne Guess, PA-C  naproxen (NAPROSYN) 500 MG tablet Take 1 tablet (500 mg total) by mouth 2 (two) times daily with a meal. 10/29/18   Ashe Gago B, FNP    Allergies Patient has no known allergies.  No family history on file.  Social History Social History   Tobacco Use  . Smoking status: Current Every Day Smoker    Packs/day: 0.25    Types: Cigarettes  . Smokeless tobacco: Never Used  Substance Use Topics  . Alcohol use: Yes  . Drug use: No    Review of Systems Constitutional: Negative for fever or recent illness. ENT: Positive for dental pain. Musculoskeletal: Negative for trismus of the jaw.  Skin: Negative for wound or lesion.  ____________________________________________   PHYSICAL EXAM:  VITAL SIGNS: ED Triage Vitals  Enc Vitals Group     BP 10/29/18 1643 124/71     Pulse Rate 10/29/18 1643 87     Resp 10/29/18 1643 16     Temp 10/29/18 1643 99.4 F (37.4 C)     Temp Source 10/29/18 1643 Oral     SpO2 10/29/18 1643 97 %     Weight 10/29/18 1641 179 lb 14.3 oz (81.6 kg)     Height --      Head Circumference --      Peak Flow --      Pain Score 10/29/18 1641 8     Pain Loc --      Pain Edu? --      Excl. in [place]? --     Constitutional: Alert and oriented. Well appearing and in no acute distress. Eyes: Conjunctiva are clear without discharge or drainage. Mouth/Throat: Sublingual surface is soft. Periodontal Exam    Hematological/Lymphatic/Immunilogical: no lymphadenopathy. Respiratory: Respirations even and unlabored. Musculoskeletal: Full ROM of the jaw. Neurologic: Awake, alert, oriented.  Skin: left side facial swelling without erythema. Psychiatric: Affect and behavior intact.  ____________________________________________   LABS (all labs ordered are listed, but only abnormal results are displayed)  Labs Reviewed - No data to display ____________________________________________   RADIOLOGY  Not indicated. ____________________________________________   PROCEDURES  Procedure(s) performed:   Procedures  Critical Care performed: No ____________________________________________   INITIAL IMPRESSION / ASSESSMENT AND PLAN / ED COURSE  Charles Lowery is a 38 y.o. male who presents to the emergency department for treatment and evaluation of dental pain and facial swelling. He will be treated with naprosyn and amoxicillin. He is to keep his scheduled dentist appointment. [facility] that change or worsen if unable to schedule an appointment.  Pertinent labs & imaging results that were available during my care of the patient were reviewed by me and  considered in my medical decision making (see chart for details).  ____________________________________________   FINAL CLINICAL IMPRESSION(S) / ED DIAGNOSES  Final diagnoses:  Dental abscess    Discharge Medication List as of 10/29/2018  6:49 PM      If controlled substance prescribed during this visit, 12 month history viewed on the [facility] prior to issuing an initial prescription for Schedule II or III opiod.  Note:  This document was prepared using Dragon voice recognition software and may include unintentional dictation errors.   Chinita Pesterriplett, Pluma Diniz B, FNP 10/29/18 2054    Shaune PollackIsaacs, Cameron, MD 10/30/18 1025

## 2021-06-27 ENCOUNTER — Other Ambulatory Visit: Payer: Self-pay

## 2021-06-27 ENCOUNTER — Encounter: Payer: Self-pay | Admitting: Emergency Medicine

## 2021-06-27 ENCOUNTER — Emergency Department
Admission: EM | Admit: 2021-06-27 | Discharge: 2021-06-27 | Disposition: A | Discharge status: Home / Self Care | Payer: Self-pay | Attending: Emergency Medicine | Admitting: Emergency Medicine

## 2021-06-27 DIAGNOSIS — N342 Other urethritis: Secondary | ICD-10-CM

## 2021-06-27 DIAGNOSIS — N341 Nonspecific urethritis: Secondary | ICD-10-CM | POA: Insufficient documentation

## 2021-06-27 LAB — URINALYSIS, ROUTINE W REFLEX MICROSCOPIC
Bilirubin Urine: NEGATIVE
Glucose, UA: NEGATIVE mg/dL
Hgb urine dipstick: NEGATIVE
Ketones, ur: NEGATIVE mg/dL
Nitrite: NEGATIVE
Protein, ur: 30 mg/dL — AB
Specific Gravity, Urine: 1.027 (ref 1.005–1.030)
Squamous Epithelial / LPF: NONE SEEN (ref 0–5)
WBC, UA: >50 WBC/hpf — ABNORMAL HIGH (ref 0–5)
pH: 5 (ref 5.0–8.0)

## 2021-06-27 LAB — CHLAMYDIA/NGC RT PCR (ARMC ONLY): Chlamydia Tr: DETECTED — AB

## 2021-06-27 LAB — CHLAMYDIA/NGC RT PCR (ARMC ONLY)??????????: N gonorrhoeae: DETECTED — AB

## 2021-06-27 MED ORDER — LIDOCAINE HCL (PF) 1 % IJ SOLN
INTRAMUSCULAR | Status: AC
  Administered 2021-06-27: 2.1 mL
  Filled 2021-06-27: qty 5

## 2021-06-27 MED ORDER — CEFTRIAXONE SODIUM 1 G IJ SOLR
500.0000 mg | Freq: Once | INTRAMUSCULAR | Status: AC
  Administered 2021-06-27: 500 mg via INTRAMUSCULAR
  Filled 2021-06-27: qty 10

## 2021-06-27 MED ORDER — DOXYCYCLINE HYCLATE 100 MG PO CAPS: 100.0000 mg | ORAL_CAPSULE | Freq: Two times a day (BID) | ORAL | 0 refills | Status: AC

## 2021-06-27 NOTE — ED Provider Notes (Signed)
? ?[facility] ?Provider Note ? ? ? Event Date/Time  ? First MD Initiated Contact with Patient 06/27/21 1611   ?  (approximate) ? ? ?History  ? ?SEXUALLY TRANSMITTED DISEASE ? ? ?HPI ? ?Charles Lowery is a 41 y.o. male without significant past medical history who presents for evaluation approximate 1 week of burning with urination.  She has had a slight amount of yellowish discharge.  No rashes, blistering, penile lesions, testicular or scrotal pain or any other clear associated symptoms including fevers, back pain or abdominal pain.  Patient states this all started after he returned from a trip from [place] when he had some unprotected sex when he was intoxicated.  He is denying any other acute concerns at this time. ? ?  ? ? ?Physical Exam  ?Triage Vital Signs: ?ED Triage Vitals [06/27/21 1437]  ?Enc Vitals Group  ?   BP (!) 126/99  ?   Pulse Rate 93  ?   Resp 20  ?   Temp 98.1 ?F (36.7 ?C)  ?   Temp Source Oral  ?   SpO2 99 %  ?   Weight 170 lb (77.1 kg)  ?   Height 6' (1.829 m)  ?   Head Circumference   ?   Peak Flow   ?   Pain Score 6  ?   Pain Loc   ?   Pain Edu?   ?   Excl. in [place]?   ? ? ?Most recent vital signs: ?Vitals:  ? 06/27/21 1437  ?BP: (!) 126/99  ?Pulse: 93  ?Resp: 20  ?Temp: 98.1 ?F (36.7 ?C)  ?SpO2: 99%  ? ? ?General: Awake, no distress.  ?CV:  Good peripheral perfusion.  ?Resp:  Normal effort.  ?Abd:  No distention.  ?Other:   ? ? ?ED Results / Procedures / Treatments  ?Labs ?(all labs ordered are listed, but only abnormal results are displayed) ?Labs Reviewed  ?URINALYSIS, ROUTINE W REFLEX MICROSCOPIC - Abnormal; Notable for the following components:  ?    Result Value  ? Color, Urine YELLOW (*)   ? APPearance CLOUDY (*)   ? Protein, ur 30 (*)   ? Leukocytes,Ua LARGE (*)   ? WBC, UA >50 (*)   ? Bacteria, UA RARE (*)   ? All other components within normal limits  ?CHLAMYDIA/NGC RT PCR ([facility] ONLY)            ?URINE CULTURE  ? ? ? ?EKG ? ? ?RADIOLOGY ? ? ? ?PROCEDURES: ? ?Critical  Care performed: No ? ?Procedures ? ? ? ?MEDICATIONS ORDERED IN ED: ?Medications  ?cefTRIAXone (ROCEPHIN) injection 500 mg (has no administration in time range)  ? ? ? ?IMPRESSION / MDM / ASSESSMENT AND PLAN / ED COURSE  ?I reviewed the triage vital signs and the nursing notes. ?             ?               ? ?Differential diagnosis includes, but is not limited to severe arthritis likely secondary to gonorrhea, committee a or other possible STD.  He is denying any lesions or testicular or scrotal pain to suggest an HSV infection, epididymitis, torsion or other life-threatening process.  Urine sent to the lab from triage for [facility] testing.  In addition a UA was sent.  This does show a large leukocyte esterase and greater than 50 WBCs with some bacteria.  Urine culture added.  Discussed recommendation to obtain  additional STD testing including HIV, syphilis and hepatitis although patient states he does not want to get this done at emergency room but will go to the health department to get this done.  I think this is reasonable.  We will treat empirically for gonorrhea chlamydia with IM Rocephin and a course of Doxy.  Discussed returning for any new or worsening of symptoms and follow-up with PCP.  Counseled on safe sex practices.  Discharged in stable condition.  Strict return precautions advised and discussed. ? ?  ? ? ?FINAL CLINICAL IMPRESSION(S) / ED DIAGNOSES  ? ?Final diagnoses:  ?Urethritis  ? ? ? ?Rx / DC Orders  ? ?ED Discharge Orders   ? ?      Ordered  ?  doxycycline (VIBRAMYCIN) 100 MG capsule  2 times daily       ? 06/27/21 1628  ? ?  ?  ? ?  ? ? ? ?Note:  This document was prepared using Dragon voice recognition software and may include unintentional dictation errors. ?  ?Lucrezia Starch, MD ?06/27/21 1628 ? ?

## 2021-06-27 NOTE — ED Provider Triage Note (Signed)
Emergency Medicine Provider Triage Evaluation Note ? ?Charles Lowery , a 41 y.o. male  was evaluated in triage.  Pt complains of dysuria x 3 days after unprotected intercourse. No fever. ? ?Review of Systems  ?Positive: Dysuria ?Negative: Fever ? ?Physical Exam  ?BP (!) 126/99 (BP Location: Right Arm)   Pulse 93   Temp 98.1 ?F (36.7 ?C) (Oral)   Resp 20   Ht 6' (1.829 m)   Wt 77.1 kg   SpO2 99%   BMI 23.06 kg/m?  ?Gen:   Awake, no distress   ?Resp:  Normal effort  ?MSK:   Moves extremities without difficulty  ?Other:   ? ?Medical Decision Making  ?Medically screening exam initiated at 2:41 PM.  Appropriate orders placed.  Charles Lowery was informed that the remainder of the evaluation will be completed by another provider, this initial triage assessment does not replace that evaluation, and the importance of remaining in the ED until their evaluation is complete. ?  ?Chinita Pester, FNP ?06/27/21 1443 ? ?

## 2021-06-27 NOTE — ED Triage Notes (Signed)
Pt to ED via POV with c/o going to "I went to [facility] and did somethings that I should not have done and now shit aint right". It is painful urination and yellow discharge.  ?

## 2021-06-29 LAB — URINE CULTURE: Culture: NO GROWTH

## 2021-07-04 ENCOUNTER — Telehealth: Payer: Self-pay | Admitting: Emergency Medicine

## 2021-07-04 NOTE — Telephone Encounter (Signed)
Called and informed patient of std results and he understands and has already told partner. ?

## 2022-01-21 ENCOUNTER — Emergency Department
Admission: EM | Admit: 2022-01-21 | Discharge: 2022-01-21 | Disposition: A | Discharge status: Home / Self Care | Payer: Self-pay | Attending: Student in an Organized Health Care Education/Training Program | Admitting: Student in an Organized Health Care Education/Training Program

## 2022-01-21 ENCOUNTER — Encounter: Payer: Self-pay | Admitting: Emergency Medicine

## 2022-01-21 ENCOUNTER — Other Ambulatory Visit: Payer: Self-pay

## 2022-01-21 DIAGNOSIS — Z202 Contact with and (suspected) exposure to infections with a predominantly sexual mode of transmission: Secondary | ICD-10-CM

## 2022-01-21 DIAGNOSIS — A64 Unspecified sexually transmitted disease: Secondary | ICD-10-CM | POA: Insufficient documentation

## 2022-01-21 LAB — CHLAMYDIA/NGC RT PCR (ARMC ONLY)
Chlamydia Tr: NOT DETECTED
N gonorrhoeae: NOT DETECTED

## 2022-01-21 NOTE — ED Notes (Signed)
See triage note. Pt to ED asking for gc/chl testing. Specimens already sent.

## 2022-01-21 NOTE — ED Triage Notes (Signed)
Pt via POV from home. Pt here for STD testing, states he has recent exposure to chlamydia.

## 2022-01-21 NOTE — ED Provider Notes (Signed)
   [facility] Provider Note    Event Date/Time   First MD Initiated Contact with Patient 01/21/22 1446     (approximate)   History   Exposure to STD   HPI  Charles Lowery is a 41 y.o. male who presents to the ER after exposure to STD.  States that his partner recently tested positive for chlamydia.  He is asymptomatic denies any pain no dysuria no drip no testicular discomfort no fevers or chills.  Does not have any interest in being tested for HIV.     Physical Exam   Triage Vital Signs: ED Triage Vitals  Enc Vitals Group     BP 01/21/22 1412 (!) 144/96     Pulse Rate 01/21/22 1412 (!) 103     Resp 01/21/22 1412 18     Temp 01/21/22 1412 98.3 F (36.8 C)     Temp Source 01/21/22 1412 Oral     SpO2 01/21/22 1412 99 %     Weight 01/21/22 1410 180 lb (81.6 kg)     Height 01/21/22 1410 6' (1.829 m)     Head Circumference --      Peak Flow --      Pain Score 01/21/22 1410 0     Pain Loc --      Pain Edu? --      Excl. in [place]? --     Most recent vital signs: Vitals:   01/21/22 1412  BP: (!) 144/96  Pulse: (!) 103  Resp: 18  Temp: 98.3 F (36.8 C)  SpO2: 99%     Constitutional: Alert  Eyes: Conjunctivae are normal.  Head: Atraumatic. Neck: Painless ROM.  Cardiovascular:   Good peripheral circulation. Respiratory: Normal respiratory effort.  No retractions.  Musculoskeletal:  no deformity Neurologic:  MAE spontaneously. No gross focal neurologic deficits are appreciated.  Skin:  Skin is warm, dry and intact. No rash noted. Psychiatric: Mood and affect are normal. Speech and behavior are normal.    ED Results / Procedures / Treatments   Labs (all labs ordered are listed, but only abnormal results are displayed) Labs Reviewed  CHLAMYDIA/NGC RT PCR ([facility] ONLY)               EKG     RADIOLOGY    PROCEDURES:  Critical Care performed:   Procedures   MEDICATIONS ORDERED IN ED: Medications - No data to  display   IMPRESSION / MDM / [name] / ED COURSE  I reviewed the triage vital signs and the nursing notes.                              Differential diagnosis includes, but is not limited to, STD check  Patient here for STD check.  [place] [facility] negative.  Declined HIV testing.  He is asymptomatic.  No further work-up indicated.  Discussed safe sex practices and signs and symptoms for which she should seek medical attention.       FINAL CLINICAL IMPRESSION(S) / ED DIAGNOSES   Final diagnoses:  STD exposure     Rx / DC Orders   ED Discharge Orders     None        Note:  This document was prepared using Dragon voice recognition software and may include unintentional dictation errors.    Merlyn Lot, MD 01/21/22 [phone]

## 2023-03-19 ENCOUNTER — Telehealth: Payer: Self-pay | Admitting: Emergency Medicine

## 2023-03-19 ENCOUNTER — Encounter: Payer: Self-pay | Admitting: Medical Oncology

## 2023-03-19 ENCOUNTER — Emergency Department
Admission: EM | Admit: 2023-03-19 | Discharge: 2023-03-19 | Disposition: A | Discharge status: Home / Self Care | Payer: Self-pay | Attending: Emergency Medicine | Admitting: Emergency Medicine

## 2023-03-19 ENCOUNTER — Other Ambulatory Visit: Payer: Self-pay

## 2023-03-19 DIAGNOSIS — A749 Chlamydial infection, unspecified: Secondary | ICD-10-CM

## 2023-03-19 DIAGNOSIS — Z202 Contact with and (suspected) exposure to infections with a predominantly sexual mode of transmission: Secondary | ICD-10-CM

## 2023-03-19 DIAGNOSIS — A549 Gonococcal infection, unspecified: Secondary | ICD-10-CM

## 2023-03-19 LAB — URINALYSIS, ROUTINE W REFLEX MICROSCOPIC
Bacteria, UA: NONE SEEN
Bilirubin Urine: NEGATIVE
Glucose, UA: NEGATIVE mg/dL
Hgb urine dipstick: NEGATIVE
Ketones, ur: NEGATIVE mg/dL
Nitrite: NEGATIVE
Protein, ur: NEGATIVE mg/dL
Specific Gravity, Urine: 1.021 (ref 1.005–1.030)
Squamous Epithelial / HPF: 0 /[HPF] (ref 0–5)
WBC, UA: >50 WBC/hpf (ref 0–5)
pH: 6 (ref 5.0–8.0)

## 2023-03-19 LAB — CHLAMYDIA/NGC RT PCR (ARMC ONLY)
Chlamydia Tr: DETECTED — AB
N gonorrhoeae: DETECTED — AB

## 2023-03-19 MED ORDER — METRONIDAZOLE 500 MG PO TABS: 2000.0000 mg | ORAL_TABLET | Freq: Once | ORAL | 0 refills | Status: AC

## 2023-03-19 MED ORDER — LIDOCAINE HCL (PF) 1 % IJ SOLN
5.0000 mL | Freq: Once | INTRAMUSCULAR | Status: AC
  Administered 2023-03-19: 5 mL via INTRADERMAL
  Filled 2023-03-19: qty 5

## 2023-03-19 MED ORDER — AZITHROMYCIN 500 MG PO TABS
1000.0000 mg | ORAL_TABLET | Freq: Once | ORAL | Status: AC
  Administered 2023-03-19: 1000 mg via ORAL
  Filled 2023-03-19: qty 2

## 2023-03-19 MED ORDER — DOXYCYCLINE HYCLATE 100 MG PO TABS: 100.0000 mg | ORAL_TABLET | Freq: Two times a day (BID) | ORAL | 0 refills | Status: AC

## 2023-03-19 MED ORDER — CEFTRIAXONE SODIUM 1 G IJ SOLR
500.0000 mg | Freq: Once | INTRAMUSCULAR | Status: AC
  Administered 2023-03-19: 500 mg via INTRAMUSCULAR
  Filled 2023-03-19: qty 10

## 2023-03-19 NOTE — ED Notes (Signed)
See triage note  Presents with a 2 day hx of penile discharge  No fever or other sxs'

## 2023-03-19 NOTE — ED Provider Notes (Signed)
[facility] Provider Note    Event Date/Time   First MD Initiated Contact with Patient 03/19/23 [id number]     (approximate)   History   Penile Discharge   HPI  Charles Lowery is a 42 y.o. male with history of hemihypertrophy presents emergency department with penile discharge for 2 days.  States his girlfriend went to the doctor for yearly exam last week and was told she had some sort of STD.  Patient states that the discharge has been yellow to green in color.  No fever or chills.  No abdominal pain.  No diarrhea.  Denies any sores on his penis      Physical Exam   Triage Vital Signs: ED Triage Vitals [03/19/23 0912]  Encounter Vitals Group     BP (!) 149/100     Systolic BP Percentile      Diastolic BP Percentile      Pulse Rate 95     Resp 18     Temp 98 F (36.7 C)     Temp Source Oral     SpO2 97 %     Weight 170 lb (77.1 kg)     Height 6' (1.829 m)     Head Circumference      Peak Flow      Pain Score 0     Pain Loc      Pain Education      Exclude from Growth Chart     Most recent vital signs: Vitals:   03/19/23 0912  BP: (!) 149/100  Pulse: 95  Resp: 18  Temp: 98 F (36.7 C)  SpO2: 97%     General: Awake, no distress.   CV:  Good peripheral perfusion. regular rate and  rhythm Resp:  Normal effort.  Abd:  No distention.   Other:  Penile exam deferred by patient   ED Results / Procedures / Treatments   Labs (all labs ordered are listed, but only abnormal results are displayed) Labs Reviewed  CHLAMYDIA/NGC RT PCR ([facility] ONLY)           - Abnormal; Notable for the following components:      Result Value   Chlamydia Tr DETECTED (*)    N gonorrhoeae DETECTED (*)    All other components within normal limits  URINALYSIS, ROUTINE W REFLEX MICROSCOPIC - Abnormal; Notable for the following components:   Color, Urine YELLOW (*)    APPearance HAZY (*)    Leukocytes,Ua SMALL (*)    All other components within normal limits      EKG     RADIOLOGY     PROCEDURES:   Procedures   MEDICATIONS ORDERED IN ED: Medications  cefTRIAXone (ROCEPHIN) injection 500 mg (500 mg Intramuscular Given 03/19/23 0944)  azithromycin (ZITHROMAX) tablet 1,000 mg (1,000 mg Oral Given 03/19/23 0943)  lidocaine (PF) (XYLOCAINE) 1 % injection 5 mL (5 mLs Intradermal Given 03/19/23 0944)     IMPRESSION / MDM / ASSESSMENT AND PLAN / ED COURSE  I reviewed the triage vital signs and the nursing notes.                              Differential diagnosis includes, but is not limited to, gonorrhea, chlamydia, trichomoniasis, UTI  Patient's presentation is most consistent with acute illness / injury with system symptoms.   Due to the positive exposure along with yellow discharge we will go ahead and empirically  treat patient for gonorrhea and chlamydia.  Explained to him he does need to check with his girlfriend to see if she has trichomoniasis as that really shows up in men and he would need to be treated.   Gonorrhea/chlamydia test are both positive.  Patient had been given Rocephin 500 mg IM, Zithromax 1 g p.o., prescription for Flagyl 2 g p.o. and doxycycline 100 mg twice daily for 7 days.  He is to follow-up with health department as needed.  Notify all partners.  Wear a condom.  No sex with anyone for at least 7 days after finishing treatment.  He is in agreement treatment plan.  I did phone him with the test results.   FINAL CLINICAL IMPRESSION(S) / ED DIAGNOSES   Final diagnoses:  STD exposure  Gonorrhea  Chlamydia     Rx / DC Orders   ED Discharge Orders          Ordered    doxycycline (VIBRA-TABS) 100 MG tablet  2 times daily        03/19/23 0951    metroNIDAZOLE (FLAGYL) 500 MG tablet   Once        03/19/23 1610             Note:  This document was prepared using Dragon voice recognition software and may include unintentional dictation errors.    Faythe Ghee, PA-C 03/19/23 1451     Minna Antis, MD 03/19/23 [phone]

## 2023-03-19 NOTE — Telephone Encounter (Signed)
Patient called me back.  Explained std results.  Need for partner treatment, free treatment available at  [facility], abstinence until 7 days after treatment ends for last partner.  He says he has his prescriptions and is taking.

## 2023-03-19 NOTE — ED Triage Notes (Signed)
Pt reports penile discharge x 2 days

## 2023-03-19 NOTE — Telephone Encounter (Signed)
Called patient to make him aware of std results.  Left voicemail with my number to call back.  He was treated during visit.
